# Patient Record
Sex: Female | Born: 1960 | Race: Black or African American | Hispanic: No | Marital: Single
Health system: Southern US, Community
[De-identification: ages and names within clinical notes are randomized; demographics above are authoritative.]

---

## 2021-04-07 ENCOUNTER — Other Ambulatory Visit: Payer: Self-pay

## 2021-04-07 ENCOUNTER — Emergency Department (HOSPITAL_COMMUNITY)
Admission: EM | Admit: 2021-04-07 | Discharge: 2021-04-08 | Disposition: A | Discharge status: Home / Self Care | Payer: Medicare Other | Attending: Emergency Medicine | Admitting: Emergency Medicine

## 2021-04-07 ENCOUNTER — Emergency Department (HOSPITAL_COMMUNITY): Payer: Medicare Other

## 2021-04-07 DIAGNOSIS — Y92410 Unspecified street and highway as the place of occurrence of the external cause: Secondary | ICD-10-CM | POA: Diagnosis not present

## 2021-04-07 DIAGNOSIS — S0990XA Unspecified injury of head, initial encounter: Secondary | ICD-10-CM | POA: Diagnosis not present

## 2021-04-07 DIAGNOSIS — S4992XA Unspecified injury of left shoulder and upper arm, initial encounter: Secondary | ICD-10-CM | POA: Insufficient documentation

## 2021-04-07 DIAGNOSIS — S299XXA Unspecified injury of thorax, initial encounter: Secondary | ICD-10-CM | POA: Insufficient documentation

## 2021-04-07 DIAGNOSIS — S99921A Unspecified injury of right foot, initial encounter: Secondary | ICD-10-CM | POA: Insufficient documentation
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
## 2021-04-08 NOTE — ED Notes (Signed)
Patient verbalizes understanding of discharge instructions. Opportunity for questioning and answers were provided. Armband removed by staff, pt discharged from ED and ambulated to lobby to return home.   
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
